# Patient Record
Sex: Male | Born: 1985 | Race: White | Hispanic: No | Marital: Single | Smoking: Never smoker
Health system: Southern US, Community
[De-identification: ages and names within clinical notes are randomized; demographics above are authoritative.]

## PROBLEM LIST (undated history)

## (undated) DIAGNOSIS — R1013 Epigastric pain: Secondary | ICD-10-CM

## (undated) HISTORY — DX: Epigastric pain: R10.13

## (undated) HISTORY — PX: WISDOM TOOTH EXTRACTION: SHX21

---
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
## 2008-01-29 ENCOUNTER — Ambulatory Visit: Payer: Self-pay | Admitting: Internal Medicine

## 2008-01-29 LAB — CONVERTED CEMR LAB
ALT: 19 units/L (ref 0–53)
AST: 22 units/L (ref 0–37)
Basophils Absolute: 0 10*3/uL (ref 0.0–0.1)
Basophils Relative: 0.2 % (ref 0.0–3.0)
Bilirubin, Direct: 0.2 mg/dL (ref 0.0–0.3)
CO2: 30 meq/L (ref 19–32)
Calcium: 9.6 mg/dL (ref 8.4–10.5)
Chloride: 109 meq/L (ref 96–112)
Creatinine, Ser: 1 mg/dL (ref 0.4–1.5)
Glucose, Bld: 89 mg/dL (ref 70–99)
Hemoglobin: 15.4 g/dL (ref 13.0–17.0)
LDL Cholesterol: 119 mg/dL — ABNORMAL HIGH (ref 0–99)
Leukocytes, UA: NEGATIVE
Lymphocytes Relative: 34.2 % (ref 12.0–46.0)
MCHC: 35.8 g/dL (ref 30.0–36.0)
Monocytes Relative: 7.6 % (ref 3.0–12.0)
Neutro Abs: 3.8 10*3/uL (ref 1.4–7.7)
Neutrophils Relative %: 52.2 % (ref 43.0–77.0)
Nitrite: NEGATIVE
RBC: 4.77 M/uL (ref 4.22–5.81)
Specific Gravity, Urine: 1.01 (ref 1.000–1.03)
Total Bilirubin: 1.2 mg/dL (ref 0.3–1.2)
Total CHOL/HDL Ratio: 5.9
Total Protein: 7.5 g/dL (ref 6.0–8.3)
Triglycerides: 161 mg/dL — ABNORMAL HIGH (ref 0–149)
Urobilinogen, UA: 0.2 (ref 0.0–1.0)
WBC: 7.3 10*3/uL (ref 4.5–10.5)
pH: 6 (ref 5.0–8.0)

## 2008-02-06 ENCOUNTER — Ambulatory Visit: Payer: Self-pay | Admitting: Internal Medicine

## 2008-02-06 DIAGNOSIS — J45909 Unspecified asthma, uncomplicated: Secondary | ICD-10-CM | POA: Insufficient documentation

## 2008-03-04 ENCOUNTER — Telehealth: Payer: Self-pay | Admitting: Internal Medicine

## 2009-08-05 ENCOUNTER — Telehealth: Payer: Self-pay | Admitting: Internal Medicine

## 2010-06-14 NOTE — Progress Notes (Signed)
  Phone Note Refill Request Message from:  Fax from Pharmacy on August 05, 2009 1:52 PM  Refills Requested: Medication #1:  VENTOLIN HFA 108 (90 BASE) MCG/ACT AERS 2 puffs qid as needed Initial call taken by: Ami Bullins CMA,  August 05, 2009 1:52 PM    Prescriptions: VENTOLIN HFA 108 (90 BASE) MCG/ACT AERS (ALBUTEROL SULFATE) 2 puffs qid as needed  #1 x 0   Entered by:   Ami Bullins CMA   Authorized by:   Jacques Navy MD   Signed by:   Bill Salinas CMA on 08/05/2009   Method used:   Electronically to        [facility]* (retail)       [address]       [place], [place]  [id number]       Ph: [phone]       Fax: [phone]   RxID:   [id number]

## 2011-01-03 ENCOUNTER — Telehealth: Payer: Self-pay | Admitting: *Deleted

## 2011-01-03 ENCOUNTER — Encounter: Payer: Self-pay | Admitting: Internal Medicine

## 2011-01-03 ENCOUNTER — Ambulatory Visit (INDEPENDENT_AMBULATORY_CARE_PROVIDER_SITE_OTHER): Payer: BC Managed Care – PPO | Admitting: Internal Medicine

## 2011-01-03 VITALS — BP 122/80 | HR 84 | Temp 97.9°F | Wt 195.0 lb

## 2011-01-03 DIAGNOSIS — R221 Localized swelling, mass and lump, neck: Secondary | ICD-10-CM

## 2011-01-03 DIAGNOSIS — R220 Localized swelling, mass and lump, head: Secondary | ICD-10-CM

## 2011-01-03 MED ORDER — IPRATROPIUM BROMIDE 0.03 % NA SOLN: NASAL | Status: DC

## 2011-01-03 NOTE — Telephone Encounter (Signed)
Pharm called - RX for atrovent nasal spray was sent in to pharm. They wanted to confirm if MD really wanted to prescribe nasal spray OR was it supposed to be the inhaler?

## 2011-01-03 NOTE — Telephone Encounter (Signed)
Called pharmacy. inhaler

## 2011-01-03 NOTE — Progress Notes (Signed)
  Subjective:    Patient ID: Dustin Roach, male    DOB: 1985/10/26, 25 y.o.   MRN: [id number]  HPI Dustin Roach presents for evaluation of a swelling in the throat described as a sense of swelling or mass effect. He says this is exacerbated by the rare, intermittent use of albuterol MDI. He does report that the sensation of swelling in his airway is improved WU:[id number] but the swelling sensation in the throat is worse. This feeling of mass or swelling in his throat has been present for 4-6 months. He does not smoke or drink excessively. He is otherwise healthy with no other c/o.  Past Medical History  Diagnosis Date  . Asthma   . Allergic rhinitis    No past surgical history on file. Family History  Problem Relation Age of Onset  . Diabetes Mother    History   Social History  . Marital Status: Single    Spouse Name: N/A    Number of Children: N/A  . Years of Education: N/A   Occupational History  . Not on file.   Social History Main Topics  . Smoking status: Not on file  . Smokeless tobacco: Not on file  . Alcohol Use: Not on file  . Drug Use: Not on file  . Sexually Active: Not on file   Other Topics Concern  . Not on file   Social History Narrative   HSG, [employer], applying for an MFAMarried '2006No childrenWork: pt account rep at [employer]       Review of Systems System review is negative for any constitutional, cardiac, pulmonary, GI or neuro symptoms or complaints     Objective:   Physical Exam Vitals noted - normal Gen'l - WNWD white male in no distress HEENT - normal exam with no visible swelling in the posterior pharynx, tonsils present but not enlarged, uvula was normal, A-P dimension appeared normal. Chest - CTAP w/o rales, or wheezes Cor - 2+ radial pulse, RRR       Assessment & Plan:  Throat swelling - this sounds like a problem that is independent of his use of bronchodilator, but made worse with use of albuterol. He has been using  albuterol without problems for years. Concern is for any mass or anatomic obstruction in the upper airway.  Plan - switch to atrovent MDI           Refer to ENT for evaluation for any upper airway mass or abnormality.

## 2011-01-10 ENCOUNTER — Telehealth: Payer: Self-pay | Admitting: Internal Medicine

## 2011-01-10 NOTE — Telephone Encounter (Signed)
Forwarded to Dr. Norins for Review. °

## 2012-04-24 ENCOUNTER — Telehealth: Payer: Self-pay | Admitting: Internal Medicine

## 2012-04-24 ENCOUNTER — Telehealth: Payer: Self-pay | Admitting: *Deleted

## 2012-04-24 NOTE — Telephone Encounter (Signed)
Pt called stating he needed to have some blood work done. Pt and his wife are seeing a fertility specialist at [facility]. They told him he needed certain blood work done. Advised pt that he needed to schedule an appt. Since he has not had a physical in some time, advised pt to schedule a physical exam. I forwarded the call to scheduling for pt.

## 2012-04-24 NOTE — Telephone Encounter (Signed)
Patient's wife is having a procedure in early January and he has to have STD testing done before this procedure takes place, has not been seen since August 2012 and you next physical is not open until after his deadline, is there anyway for him to have this done without being seen first or is there another way he needs to go about having this done?

## 2012-04-25 NOTE — Telephone Encounter (Signed)
Left message for patient to call back and schedule.

## 2012-04-25 NOTE — Telephone Encounter (Signed)
May add to schedule 12/12

## 2012-04-26 NOTE — Telephone Encounter (Signed)
Patient scheduled for the 30th for office visit.

## 2012-05-13 ENCOUNTER — Other Ambulatory Visit (INDEPENDENT_AMBULATORY_CARE_PROVIDER_SITE_OTHER): Payer: BC Managed Care – PPO

## 2012-05-13 ENCOUNTER — Encounter: Payer: Self-pay | Admitting: Internal Medicine

## 2012-05-13 ENCOUNTER — Ambulatory Visit (INDEPENDENT_AMBULATORY_CARE_PROVIDER_SITE_OTHER): Payer: BC Managed Care – PPO | Admitting: Internal Medicine

## 2012-05-13 VITALS — BP 122/80 | HR 79 | Temp 97.0°F | Resp 10 | Wt 204.0 lb

## 2012-05-13 DIAGNOSIS — Z202 Contact with and (suspected) exposure to infections with a predominantly sexual mode of transmission: Secondary | ICD-10-CM

## 2012-05-13 DIAGNOSIS — Z9189 Other specified personal risk factors, not elsewhere classified: Secondary | ICD-10-CM

## 2012-05-13 DIAGNOSIS — Z0000 Encounter for general adult medical examination without abnormal findings: Secondary | ICD-10-CM

## 2012-05-13 LAB — LIPID PANEL: Total CHOL/HDL Ratio: 6

## 2012-05-13 LAB — COMPREHENSIVE METABOLIC PANEL
ALT: 39 U/L (ref 0–53)
AST: 26 U/L (ref 0–37)
CO2: 30 mEq/L (ref 19–32)
Calcium: 9.4 mg/dL (ref 8.4–10.5)
Chloride: 101 mEq/L (ref 96–112)
GFR: 102.12 mL/min (ref >60.00)
Sodium: 141 mEq/L (ref 135–145)
Total Protein: 7.6 g/dL (ref 6.0–8.3)

## 2012-05-13 LAB — HEPATITIS C ANTIBODY: HCV Ab: NEGATIVE

## 2012-05-13 LAB — HEPATITIS B SURFACE ANTIGEN: Hepatitis B Surface Ag: NEGATIVE

## 2012-05-13 NOTE — Patient Instructions (Addendum)
Thanks for coming in. Labs are ordered included routine labs.,  Results will be mailed to sent to MyChart - see sign up sheet

## 2012-05-14 LAB — HEPATITIS B CORE ANTIBODY, TOTAL: Hep B Core Total Ab: NEGATIVE

## 2012-05-15 DIAGNOSIS — Z0000 Encounter for general adult medical examination without abnormal findings: Secondary | ICD-10-CM | POA: Insufficient documentation

## 2012-05-15 NOTE — Assessment & Plan Note (Signed)
Healthy white man with a normal exam. He is for STD screening and hepatitis screening - all negative.   Cholesterol panel reveals an LDL of 177 - he will need follow up: will recommend 6 months of life-style management with low fat diet with follow up lipid panel. If not below treatment threshold (LDL<160 per NCEP ATP III guidelines).

## 2012-05-15 NOTE — Progress Notes (Signed)
  Subjective:    Patient ID: Dustin Roach, male    DOB: [birth year], 27 y.o.   MRN: [id number]  HPI Dustin Roach presents for STD and hepatitis screening as part of infertility treatment for his wife. He reports that since his last ov he has been healthy.   Past Medical History  Diagnosis Date  . Asthma   . Allergic rhinitis    History reviewed. No pertinent past surgical history. Family History  Problem Relation Age of Onset  . Diabetes Mother   . Alcohol abuse Maternal Grandfather   . Alcohol abuse Paternal Grandfather   . Cancer Neg Hx   . Heart disease Neg Hx    History   Social History  . Marital Status: Single    Spouse Name: N/A    Number of Children: 0  . Years of Education: 16   Occupational History  . writer-films [employer]   Social History Main Topics  . Smoking status: Never Smoker   . Smokeless tobacco: Never Used  . Alcohol Use: 1.0 oz/week    2 drink(s) per week     Comment: beer  . Drug Use: No  . Sexually Active: Yes -- Male partner(s)   Other Topics Concern  . Not on file   Social History Narrative   HSG, [employer]. Married '2006. No children yet (Dec '13). 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     Current Outpatient Prescriptions on File Prior to Visit  Medication Sig Dispense Refill  . albuterol (VENTOLIN HFA) 108 (90 BASE) MCG/ACT inhaler Inhale 2 puffs into the lungs every 4 (four) hours as needed.        . cetirizine-pseudoephedrine (ZYRTEC-D) 5-120 MG per tablet Take 1 tablet by mouth daily.        . MULTIPLE VITAMIN PO Take by mouth.            Review of Systems System review is negative for any constitutional, cardiac, pulmonary, GI or neuro symptoms or complaints other than as described in the HPI.     Objective:   Physical Exam Filed Vitals:   05/13/12 1525  BP: 122/80  Pulse: 79  Temp: 97 F (36.1 C)  Resp: 10   Gen'l- WNWD white man in no  distress HEENT- C&S clear Cor - 2+ radial pulse, RRR Pulm - normal respirations Neuro - A&O x 3, normal exam.  Lab Results  Component Value Date   WBC 7.3 01/29/2008   HGB 15.4 01/29/2008   HCT 42.9 01/29/2008   PLT 217 01/29/2008   GLUCOSE 92 05/13/2012   CHOL 243* 05/13/2012   TRIG 207.0* 05/13/2012   HDL 41.40 05/13/2012   LDLDIRECT 177.6 05/13/2012   LDLCALC 119* 01/29/2008   ALT 39 05/13/2012   AST 26 05/13/2012   NA 141 05/13/2012   K 4.1 05/13/2012   CL 101 05/13/2012   CREATININE 0.9 05/13/2012   BUN 11 05/13/2012   CO2 30 05/13/2012   TSH 1.71 01/29/2008   Heb B negative, Hep c negative, HIV negative, RPR - nonreactive     Assessment & Plan:

## 2012-05-16 ENCOUNTER — Encounter: Payer: Self-pay | Admitting: Internal Medicine

## 2012-05-22 ENCOUNTER — Telehealth: Payer: Self-pay | Admitting: Internal Medicine

## 2012-05-22 NOTE — Telephone Encounter (Signed)
Copy of pt's labs faxed to Dr Elesa Hacker at [facility]. Fax number [phone].

## 2012-05-22 NOTE — Telephone Encounter (Signed)
Patient is requesting that his most recent labs be sent to Dr. Elesa Hacker @ [facility], the fax is [phone]

## 2012-06-29 ENCOUNTER — Other Ambulatory Visit: Payer: Self-pay

## 2013-03-20 ENCOUNTER — Other Ambulatory Visit: Payer: Self-pay

## 2014-09-21 ENCOUNTER — Encounter: Payer: Self-pay | Admitting: Internal Medicine

## 2014-09-21 ENCOUNTER — Ambulatory Visit (INDEPENDENT_AMBULATORY_CARE_PROVIDER_SITE_OTHER): Payer: BLUE CROSS/BLUE SHIELD | Admitting: Internal Medicine

## 2014-09-21 VITALS — BP 126/84 | HR 82 | Temp 98.4°F | Ht 70.5 in | Wt 213.0 lb

## 2014-09-21 DIAGNOSIS — J069 Acute upper respiratory infection, unspecified: Secondary | ICD-10-CM | POA: Diagnosis not present

## 2014-09-21 MED ORDER — AMOXICILLIN 500 MG PO CAPS: 500.0000 mg | ORAL_CAPSULE | Freq: Three times a day (TID) | ORAL | Status: DC

## 2014-09-21 NOTE — Patient Instructions (Signed)
Plain Mucinex (NOT D) for thick secretions ;force NON dairy fluids .   Nasal cleansing in the shower as discussed with lather of mild shampoo.After 10 seconds wash off lather while  exhaling through nostrils. Make sure that all residual soap is removed to prevent irritation.  Flonase OR Nasacort AQ 1 spray in each nostril twice a day as needed. Use the "crossover" technique into opposite nostril spraying toward opposite ear @ 45 degree angle, not straight up into nostril.  Plain Allegra (NOT D )  160 daily , Loratidine 10 mg , OR Zyrtec 10 mg @ bedtime  as needed for itchy eyes & sneezing.  Zicam Melts or Zinc lozenges as per package label for sore throat .   

## 2014-09-21 NOTE — Progress Notes (Signed)
Pre visit review using our clinic review tool, if applicable. No additional management support is needed unless otherwise documented below in the visit note. 

## 2014-09-21 NOTE — Progress Notes (Signed)
   Subjective:    Patient ID: Dustin Roach, male    DOB: [birth year], 29 y.o.   MRN: [id number]  HPI His symptoms originally began 09/09/14 as head congestion with some clear-slightly yellow nasal secretions. This was associated with postnasal drainage as well as some chest congestion. The symptoms actually improved only to exacerbate later.  As of 5/5 he developed sore throat, fever up to 100, chills, and sweats.  The discolored nasal secretions persist as well as some ear pressure. He also has some itchy, watery eyes.  His daughter 4219 months old has been ill with respiratory tract infection as has some of her  friends and the parents of those friends.  He's been using Mucinex D and Advil with some partial response.  He has an occasional cigar. His childhood asthma.  He has no other upper respiratory tract  infection symptoms   Review of Systems Frontal headache, facial pain ,  dental pain, sore throat ,  or otic discharge denied. There is no significant sputum production, wheezing,or  paroxysmal nocturnal dyspnea.      Objective:   Physical Exam  General appearance:Adequately nourished; no acute distress or increased work of breathing is present.   Pattern alopecia; mustache and beard present.  Lymphatic: No  lymphadenopathy about the head, neck, or axilla .  Eyes: No conjunctival inflammation or lid edema is present. There is slight scleritis. Extraocular motion and vision are intact  Ears:  External ear exam shows no significant lesions or deformities.  Bilateral ear posts.Otoscopic examination reveals clear canals, tympanic membranes are intact bilaterally without bulging, retraction, or discharge. Slight erythema right tympanic membranes  Nose:  External nasal examination shows no deformity or inflammation. Nasal mucosa are significantly erythematous without lesions or exudates No septal dislocation or deviation.No obstruction to airflow.   Oral exam: Dental hygiene  is good; lips and gums are healthy appearing.There is punctate exudate of each tonsil.   Neck:  No deformities, thyromegaly, masses, or tenderness noted.   Supple with full range of motion without pain.   Heart:  Normal rate and regular rhythm. S1 and S2 normal without gallop, murmur, click, rub or other extra sounds.   Lungs:Chest clear to auscultation; no wheezes, rhonchi,rales ,or rubs present.  Extremities:  No cyanosis, edema, or clubbing  noted    Skin: Warm & dry w/o tenting or jaundice. No significant lesions or rash.       Assessment & Plan:  #1 upper respiratory tract infection with suggestion of tonsillitis and possible rhinosinusitis  Plan: See orders/ recommendations

## 2015-02-08 ENCOUNTER — Telehealth: Payer: Self-pay | Admitting: *Deleted

## 2015-02-08 MED ORDER — ALBUTEROL SULFATE HFA 108 (90 BASE) MCG/ACT IN AERS: 2.0000 | INHALATION_SPRAY | RESPIRATORY_TRACT | Status: DC | PRN

## 2015-02-08 NOTE — Telephone Encounter (Signed)
Pt is requesting refill on his inhaler. Inform pt that he need to set-up appt with new provider to continuity of care...Raechel Chute

## 2017-07-06 ENCOUNTER — Telehealth: Payer: BLUE CROSS/BLUE SHIELD | Admitting: Family

## 2017-07-06 DIAGNOSIS — N50819 Testicular pain, unspecified: Secondary | ICD-10-CM | POA: Diagnosis not present

## 2017-07-06 DIAGNOSIS — Z719 Counseling, unspecified: Secondary | ICD-10-CM

## 2017-07-06 NOTE — Progress Notes (Signed)
Thank you for the details you included in the comment boxes. Those details are very helpful in determining the best course of treatment for you and help us to provide the best care. Please go to a physical location immediately as there are several things this can be which are very serious.  Based on what you shared with me it looks like you have a serious condition that should be evaluated in a face to face office visit.  NOTE: If you entered your credit card information for this eVisit, you will not be charged. You may see a "hold" on your card for the $30 but that hold will drop off and you will not have a charge processed.  If you are having a true medical emergency please call 911.  If you need an urgent face to face visit, [facility] has four urgent care centers for your convenience.  If you need care fast and have a high deductible or no insurance consider:   [email or web address] to reserve your spot online an avoid wait times  [facility] [address], [address] [place], [place] [address] 8 am to 8 pm Monday-Friday 10 am to 4 pm Saturday-Sunday *Across the street from [employer]  [facility]  [address] [place], [address] 8 am to 5 pm Monday-Friday * In the [facility] on the [facility]   The following sites will take your  insurance:  . [facility]  [phone] Get Driving Directions Find a Provider at this Location  [address] [place], [place] [address] . 10 am to 8 pm Monday-Friday . 12 pm to 8 pm Saturday-Sunday   . [facility] at [facility]  [phone] Get Driving Directions Find a Provider at this Location  [address] [place], [place] [address] . 8 am to 8 pm Monday-Friday . 9 am to 6 pm Saturday . 11 am to 6 pm Sunday   . [facility] at [facility]  [phone] Get Driving Directions  [address] [place], [place] [address] . 8 am to 8 pm Monday-Friday . 8 am to 4 pm Saturday-Sunday   Your e-visit answers were reviewed by a board certified advanced clinical practitioner to complete your personal care plan.  Thank you for using e-Visits.

## 2017-08-15 ENCOUNTER — Encounter: Payer: Self-pay | Admitting: Family

## 2017-08-15 ENCOUNTER — Ambulatory Visit (INDEPENDENT_AMBULATORY_CARE_PROVIDER_SITE_OTHER): Payer: BLUE CROSS/BLUE SHIELD | Admitting: Family

## 2017-08-15 ENCOUNTER — Other Ambulatory Visit (INDEPENDENT_AMBULATORY_CARE_PROVIDER_SITE_OTHER): Payer: BLUE CROSS/BLUE SHIELD

## 2017-08-15 VITALS — BP 108/78 | HR 62 | Temp 98.7°F | Ht 70.5 in | Wt 222.0 lb

## 2017-08-15 DIAGNOSIS — J309 Allergic rhinitis, unspecified: Secondary | ICD-10-CM

## 2017-08-15 DIAGNOSIS — R1084 Generalized abdominal pain: Secondary | ICD-10-CM

## 2017-08-15 DIAGNOSIS — R062 Wheezing: Secondary | ICD-10-CM

## 2017-08-15 LAB — CBC WITH DIFFERENTIAL/PLATELET
Basophils Absolute: 0.1 10*3/uL (ref 0.0–0.1)
Basophils Relative: 0.8 % (ref 0.0–3.0)
EOS PCT: 1.2 % (ref 0.0–5.0)
Eosinophils Absolute: 0.1 10*3/uL (ref 0.0–0.7)
HEMATOCRIT: 42.3 % (ref 39.0–52.0)
HEMOGLOBIN: 14.8 g/dL (ref 13.0–17.0)
LYMPHS ABS: 1.9 10*3/uL (ref 0.7–4.0)
Lymphocytes Relative: 25.5 % (ref 12.0–46.0)
MCHC: 35.1 g/dL (ref 30.0–36.0)
MCV: 88.5 fl (ref 78.0–100.0)
Monocytes Absolute: 0.5 10*3/uL (ref 0.1–1.0)
Monocytes Relative: 6.3 % (ref 3.0–12.0)
Neutro Abs: 4.8 10*3/uL (ref 1.4–7.7)
Neutrophils Relative %: 66.2 % (ref 43.0–77.0)
Platelets: 252 10*3/uL (ref 150.0–400.0)
RBC: 4.78 Mil/uL (ref 4.22–5.81)
RDW: 12.6 % (ref 11.5–15.5)
WBC: 7.3 10*3/uL (ref 4.0–10.5)

## 2017-08-15 LAB — COMPREHENSIVE METABOLIC PANEL
ALBUMIN: 4.4 g/dL (ref 3.5–5.2)
ALT: 14 U/L (ref 0–53)
AST: 14 U/L (ref 0–37)
Alkaline Phosphatase: 54 U/L (ref 39–117)
BUN: 14 mg/dL (ref 6–23)
CO2: 27 meq/L (ref 19–32)
Calcium: 9.7 mg/dL (ref 8.4–10.5)
Chloride: 102 mEq/L (ref 96–112)
Creatinine, Ser: 0.98 mg/dL (ref 0.40–1.50)
GFR: 92.93 mL/min (ref >60.00)
Glucose, Bld: 91 mg/dL (ref 70–99)
Potassium: 4 mEq/L (ref 3.5–5.1)
Sodium: 138 mEq/L (ref 135–145)
Total Bilirubin: 0.6 mg/dL (ref 0.2–1.2)
Total Protein: 7.4 g/dL (ref 6.0–8.3)

## 2017-08-15 MED ORDER — ALBUTEROL SULFATE (2.5 MG/3ML) 0.083% IN NEBU
2.5000 mg | INHALATION_SOLUTION | Freq: Once | RESPIRATORY_TRACT | Status: AC
Start: 2017-08-15 — End: 2017-08-15
  Administered 2017-08-15: 2.5 mg via RESPIRATORY_TRACT

## 2017-08-15 MED ORDER — ALBUTEROL SULFATE HFA 108 (90 BASE) MCG/ACT IN AERS: 2.0000 | INHALATION_SPRAY | RESPIRATORY_TRACT | 0 refills | Status: AC | PRN

## 2017-08-15 NOTE — Progress Notes (Signed)
Dustin Roach is a 32 y.o. male with the following history as recorded in EpicCare:  Patient Active Problem List   Diagnosis Date Noted  . Routine health maintenance 05/15/2012  . Allergic asthma 02/06/2008    Current Outpatient Medications  Medication Sig Dispense Refill  . albuterol (VENTOLIN HFA) 108 (90 Base) MCG/ACT inhaler Inhale 2 puffs into the lungs every 4 (four) hours as needed. 18 g 0  . MULTIPLE VITAMIN PO Take by mouth.      Marland Kitchen omeprazole (PRILOSEC) 20 MG capsule Take 20 mg by mouth daily.     No current facility-administered medications for this visit.     Allergies: Patient has no known allergies.  Past Medical History:  Diagnosis Date  . Allergic rhinitis   . Asthma     History reviewed. No pertinent surgical history.  Family History  Problem Relation Age of Onset  . Diabetes Mother   . Alcohol abuse Maternal Grandfather   . Alcohol abuse Paternal Grandfather   . Cancer Neg Hx   . Heart disease Neg Hx     Social History   Tobacco Use  . Smoking status: Never Smoker  . Smokeless tobacco: Never Used  Substance Use Topics  . Alcohol use: Yes    Alcohol/week: 1.0 oz    Types: 2 drink(s) per week    Comment: beer    Subjective:  Patient presents with concerns for increased abdominal pain/ bloating x 2-3 months; feels like increased gas pain; tried OTC probiotics with no relief; talked to provider through [facility] service and thought may have gastritis/ ulcer; has been prescribed Prilosec 20 mg; started this medication in the past few days and can already see some benefit; admits that stress level has been high stress- 32 yo and 70 month old; not sleeping well; under increased work stress; has recently started working with a Social worker to help with anxiety issues; denies any changes in bowel habits- known history of hemorrhoids but denies any blood; no vomiting or coffee grounds emesis;   Objective:  Vitals:   08/15/17 0846  BP: 108/78  Pulse: 62  Temp:  98.7 F (37.1 C)  TempSrc: Oral  SpO2: 97%  Weight: 222 lb 0.6 oz (100.7 kg)  Height: 5' 10.5" (1.791 m)    General: Well developed, well nourished, in no acute distress  Skin : Warm and dry.  Head: Normocephalic and atraumatic  Lungs: Respirations unlabored; clear to auscultation bilaterally without wheeze, rales, rhonchi  CVS exam: normal rate and regular rhythm.  Abdomen: Soft; nontender; nondistended; normoactive bowel sounds; no masses or hepatosplenomegaly  Neurologic: Alert and oriented; speech intact; face symmetrical; moves all extremities well; CNII-XII intact without focal deficit  Assessment:  1. Generalized abdominal pain   2. Allergic rhinitis, unspecified seasonality, unspecified trigger     Plan:  Suspect gastritis- due to increased stress; patient already seeing benefit with OTC Prilosec; he will increase to bid x 7 days and then decrease to qd; work on stress reduction/ healthy eating; check CBC, CMP today;  recommended abdominal ultrasound- he prefers to wait at this time; he will call back after completing the 2 weeks of Prilosec to let us know he has responded; follow-up to be determined at that time- may eventually need GI evaluation  Refill given on his albuterol inhaler.     No follow-ups on file.  Orders Placed This Encounter  Procedures  . US Abdomen Complete    Standing Status:   Future    Standing  Expiration Date:   10/16/2018    Order Specific Question:   Reason for Exam (SYMPTOM  OR DIAGNOSIS REQUIRED)    Answer:   abdominal pain    Order Specific Question:   Preferred imaging location?    Answer:   [facility]  . CBC w/Diff    Standing Status:   Future    Number of Occurrences:   1    Standing Expiration Date:   08/15/2018  . Comp Met (CMET)    Standing Status:   Future    Number of Occurrences:   1    Standing Expiration Date:   08/15/2018    Requested Prescriptions   Signed Prescriptions Disp Refills  . albuterol (VENTOLIN HFA) 108  (90 Base) MCG/ACT inhaler 18 g 0    Sig: Inhale 2 puffs into the lungs every 4 (four) hours as needed.

## 2017-08-15 NOTE — Addendum Note (Signed)
Addended by: Karma GanjaSMITH, Lilibeth Opie J on: 08/15/2017 12:02 PM   Modules accepted: Orders

## 2017-08-15 NOTE — Patient Instructions (Signed)
Gastritis, Adult Gastritis is inflammation of the stomach. There are two kinds of gastritis:  Acute gastritis. This kind develops suddenly.  Chronic gastritis. This kind lasts for a long time.  Gastritis happens when the lining of the stomach becomes weak or gets damaged. Without treatment, gastritis can lead to stomach bleeding and ulcers. What are the causes? This condition may be caused by:  An infection.  Drinking too much alcohol.  Certain medicines.  Having too much acid in the stomach.  A disease of the intestines or stomach.  Stress.  What are the signs or symptoms? Symptoms of this condition include:  Pain or a burning in the upper abdomen.  Nausea.  Vomiting.  An uncomfortable feeling of fullness after eating.  In some cases, there are no symptoms. How is this diagnosed? This condition may be diagnosed with:  A description of your symptoms.  A physical exam.  Tests. These can include: ? Blood tests. ? Stool tests. ? A test in which a thin, flexible instrument with a light and camera on the end is passed down the esophagus and into the stomach (upper endoscopy). ? A test in which a sample of tissue is taken for testing (biopsy).  How is this treated? This condition may be treated with medicines. If the condition is caused by a bacterial infection, you may be given antibiotic medicines. If it is caused by too much acid in the stomach, you may get medicines called H2 blockers, proton pump inhibitors, or antacids. Treatment may also involve stopping the use of certain medicines, such as aspirin, ibuprofen, or other nonsteroidal anti-inflammatory drugs (NSAIDs). Follow these instructions at home:  Take over-the-counter and prescription medicines only as told by your health care provider.  If you were prescribed an antibiotic, take it as told by your health care provider. Do not stop taking the antibiotic even if you start to feel better.  Drink enough  fluid to keep your urine clear or pale yellow.  Eat small, frequent meals instead of large meals. Contact a health care provider if:  Your symptoms get worse.  Your symptoms return after treatment. Get help right away if:  You vomit blood or material that looks like coffee grounds.  You have black or dark red stools.  You are unable to keep fluids down.  Your abdominal pain gets worse.  You have a fever.  You do not feel better after 1 week. This information is not intended to replace advice given to you by your health care provider. Make sure you discuss any questions you have with your health care provider. Document Released: 04/25/2001 Document Revised: 12/29/2015 Document Reviewed: 01/23/2015 Elsevier Interactive Patient Education  2018 Elsevier Inc.  

## 2017-08-16 NOTE — Progress Notes (Signed)
Spoke with patient and info given. He will call back if he is not improved. In the meantime he did have questions regarding if it could possibly be bacteria related? He said he didn't mention it to you but had thought about it. Also if it was bacterial he wants to know what type of test would be ran to check for bacteria and if you thought this would be reasonable? Also he knows to call us back in 2 weeks to let us know how he is doing. He said if he didn't see improvement he would be ready to proceed with ultrasound.

## 2017-08-20 ENCOUNTER — Encounter: Payer: Self-pay | Admitting: Family

## 2017-08-20 NOTE — Progress Notes (Signed)
Letter sent as requested.

## 2017-08-27 ENCOUNTER — Telehealth: Payer: Self-pay | Admitting: Family

## 2017-08-27 DIAGNOSIS — K219 Gastro-esophageal reflux disease without esophagitis: Secondary | ICD-10-CM

## 2017-08-27 NOTE — Telephone Encounter (Signed)
Copied from CRM [phone]. Topic: Quick Communication - See Telephone Encounter >> Aug 27, 2017 11:28 AM Eston Mouldavis, Kiyara Bouffard B wrote: CRM for notification. See Telephone encounter for: 08/27/17. PT called to let Ria ClockLaura Murray  know that he is a little  better but is experiencing a new symptom-  tingling all over after eating,  he wants to know if she recommends he see a GI specialist PT also says he wants to go the most cost effective route   his contact number is [phone]

## 2017-08-27 NOTE — Telephone Encounter (Signed)
Please advise. Thanks.  

## 2017-08-27 NOTE — Telephone Encounter (Signed)
Patient has apt to see Dr. Stan Headarl Gessner on 08/29/2017 at 9:00 AM.

## 2017-08-27 NOTE — Telephone Encounter (Signed)
Yes, let's go ahead with GI evaluation; will put in referral for him. I can't speak to costs associated with seeing another provider; let's have him do the appointment and see what they recommend.

## 2017-08-29 ENCOUNTER — Ambulatory Visit (INDEPENDENT_AMBULATORY_CARE_PROVIDER_SITE_OTHER): Payer: BLUE CROSS/BLUE SHIELD | Admitting: Internal Medicine

## 2017-08-29 ENCOUNTER — Encounter: Payer: Self-pay | Admitting: Internal Medicine

## 2017-08-29 VITALS — BP 110/78 | HR 83 | Ht 70.5 in | Wt 215.0 lb

## 2017-08-29 DIAGNOSIS — R1013 Epigastric pain: Secondary | ICD-10-CM

## 2017-08-29 DIAGNOSIS — F439 Reaction to severe stress, unspecified: Secondary | ICD-10-CM

## 2017-08-29 DIAGNOSIS — K588 Other irritable bowel syndrome: Secondary | ICD-10-CM | POA: Diagnosis not present

## 2017-08-29 NOTE — Progress Notes (Signed)
Dustin Roach 32 y.o. 1985/07/26 [id number] Referred by: Olive Bass,*  Assessment & Plan:   Encounter Diagnoses  Name Primary?  . Dyspepsia Yes  . Other irritable bowel syndrome   . 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  H. pylori infection is a possibility.  Situational stress certainly may be playing a role.  No worrisome features here.  I explained warning signs of dysphagia bleeding/anemia and unintentional weight loss and contact me if that occurs.  Will try therapy with as needed IB Gard. Continue Prilosec 20 mg daily 1 month total Low gas flatulence diet is recommended and information provided counseling is wise and he will continue  He is to contact me in about a month with an update.  If he continues to have problems would wait 2 weeks off PPI and do an H. pylori stool antigen.  At this point though it was not unreasonable to order an ultrasound of the abdomen  given abdominal pain we will hold off on that  I appreciate the opportunity to care for this patient.   I will send a copy to Ria Clock FNP Subjective:   Chief Complaint: Reflux gas hemorrhoids  HPI The patient is a 32 year old married white man with a 2-54-month history of increased gas, perhaps really began in late 2018, and a left upper quadrant bubbling and discomfort.  Antacids as needed help a little bit.  He tried a probiotic but that intensified things.  He was seen by primary care and started on Prilosec twice daily and now on daily at 20 mg dose and says he feels about 40% better.  He does also relate sometimes a postprandial tingling in the lower legs as well and wonders why.  There is an occasional sharp pain in the left upper quadrant.  He has belching and flatulence.  He believes he has hemorrhoids, he can feel pressure, and he has variable size in stools and occasional itching.  He can feel swollen  hemorrhoids protruding at times also he says.  He has eliminated caffeine citrus and other reflux provoking foods.  He does admit to be under family and work stress.  He has started seeing a Veterinary surgeon .  He has a toddler and infant home with wife.  GI review of systems is otherwise negative.  He has been wondering about the possibility of H. pylori infection.  Normal CBC and CMET 08/15/2017  An abdominal ultrasound was ordered but has not done No Known Allergies Prilosec over-the-counter 20 mg daily Past Medical History:  Diagnosis Date  . Allergic rhinitis   . Asthma    Past Surgical History:  Procedure Laterality Date  . WISDOM TOOTH EXTRACTION     Social History   Social History Narrative   HS[employer], [employer]. Married '2006.    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    Some alcohol, no tobacco no drug use   08/31/2017   family history includes Alcohol abuse in his maternal grandfather and paternal grandfather; Diabetes in his mother.   Review of Systems As per HPI all other review of systems appear negative.  Objective:   Physical Exam @BP  110/78   Pulse 83   Ht 5' 10.5" (1.791 m)   Wt 215 lb (97.5 kg)   BMI 30.41 kg/m @  General:  Well-developed, well-nourished and in no acute distress Eyes:  anicteric. ENT:   Mouth and posterior pharynx free of lesions.  Neck:   supple w/o thyromegaly or mass.  Lungs: Clear to auscultation bilaterally. Heart:  S1S2, no rubs, murmurs, gallops. Abdomen:  soft, non-tender, no hepatosplenomegaly, hernia, or mass and BS+.  Rectal: Nontender no mass brown stool Lymph:  no cervical or supraclavicular adenopathy. Extremities:   no edema, cyanosis or clubbing Skin   no rash. Neuro:  A&O x 3.  Psych:  appropriate mood and  Affect.   Data Reviewed:  Primary care notes labs in the computer

## 2017-08-29 NOTE — Patient Instructions (Addendum)
Normal BMI (Body Mass Index- based on height and weight) is between 19 and 25. Your BMI today is Body mass index is 30.41 kg/m. Marland Kitchen. Please consider follow up  regarding your BMI with your Primary Care Provider.   Today we are giving you IBgard samples to try and also a coupon.   Please read the gas and flatulence diet handout we are providing you with.   Take your Prilosec for a month and then message us as to how your doing.  At that point Dr Leone PayorGessner will decide if you need more testing.   Follow up with us 10/18/17 at 9:00AM.    I appreciate the opportunity to care for you. Stan Headarl Gessner, MD, [facility]

## 2017-08-31 ENCOUNTER — Encounter: Payer: Self-pay | Admitting: Internal Medicine

## 2017-09-05 ENCOUNTER — Ambulatory Visit: Payer: BLUE CROSS/BLUE SHIELD | Admitting: Gastroenterology

## 2017-10-09 ENCOUNTER — Encounter: Payer: Self-pay | Admitting: Internal Medicine

## 2017-10-15 ENCOUNTER — Ambulatory Visit: Payer: BLUE CROSS/BLUE SHIELD | Admitting: Nurse Practitioner

## 2017-10-18 ENCOUNTER — Ambulatory Visit: Payer: BLUE CROSS/BLUE SHIELD | Admitting: Internal Medicine

## 2017-12-11 DIAGNOSIS — Z8249 Family history of ischemic heart disease and other diseases of the circulatory system: Secondary | ICD-10-CM | POA: Diagnosis not present

## 2017-12-11 DIAGNOSIS — R0789 Other chest pain: Secondary | ICD-10-CM | POA: Diagnosis not present

## 2017-12-11 DIAGNOSIS — K30 Functional dyspepsia: Secondary | ICD-10-CM | POA: Diagnosis not present

## 2018-01-17 DIAGNOSIS — R109 Unspecified abdominal pain: Secondary | ICD-10-CM | POA: Diagnosis not present

## 2018-01-17 DIAGNOSIS — Z13220 Encounter for screening for lipoid disorders: Secondary | ICD-10-CM | POA: Diagnosis not present

## 2018-01-17 DIAGNOSIS — Z0000 Encounter for general adult medical examination without abnormal findings: Secondary | ICD-10-CM | POA: Diagnosis not present

## 2018-02-15 DIAGNOSIS — K30 Functional dyspepsia: Secondary | ICD-10-CM | POA: Diagnosis not present

## 2018-02-15 DIAGNOSIS — R109 Unspecified abdominal pain: Secondary | ICD-10-CM | POA: Diagnosis not present

## 2018-05-10 ENCOUNTER — Other Ambulatory Visit: Payer: Self-pay | Admitting: Gastroenterology

## 2018-05-10 DIAGNOSIS — R1013 Epigastric pain: Secondary | ICD-10-CM | POA: Diagnosis not present

## 2018-05-10 DIAGNOSIS — R1012 Left upper quadrant pain: Secondary | ICD-10-CM | POA: Diagnosis not present

## 2018-05-16 ENCOUNTER — Ambulatory Visit
Admission: RE | Admit: 2018-05-16 | Discharge: 2018-05-16 | Disposition: A | Discharge status: Home / Self Care | Payer: BLUE CROSS/BLUE SHIELD | Source: Ambulatory Visit | Attending: Gastroenterology | Admitting: Gastroenterology

## 2018-05-16 DIAGNOSIS — R1013 Epigastric pain: Secondary | ICD-10-CM | POA: Diagnosis not present

## 2018-05-27 DIAGNOSIS — K2930 Chronic superficial gastritis without bleeding: Secondary | ICD-10-CM | POA: Diagnosis not present

## 2018-05-27 DIAGNOSIS — R1013 Epigastric pain: Secondary | ICD-10-CM | POA: Diagnosis not present

## 2018-05-30 DIAGNOSIS — K2930 Chronic superficial gastritis without bleeding: Secondary | ICD-10-CM | POA: Diagnosis not present

## 2018-09-23 DIAGNOSIS — R300 Dysuria: Secondary | ICD-10-CM | POA: Diagnosis not present

## 2018-11-15 ENCOUNTER — Other Ambulatory Visit: Payer: Self-pay

## 2018-11-15 ENCOUNTER — Encounter (HOSPITAL_COMMUNITY): Payer: Self-pay | Admitting: Emergency Medicine

## 2018-11-15 ENCOUNTER — Emergency Department (HOSPITAL_COMMUNITY)
Admission: EM | Admit: 2018-11-15 | Discharge: 2018-11-15 | Disposition: A | Discharge status: Home / Self Care | Payer: BLUE CROSS/BLUE SHIELD | Attending: Emergency Medicine | Admitting: Emergency Medicine

## 2018-11-15 DIAGNOSIS — I499 Cardiac arrhythmia, unspecified: Secondary | ICD-10-CM | POA: Diagnosis not present

## 2018-11-15 DIAGNOSIS — I959 Hypotension, unspecified: Secondary | ICD-10-CM | POA: Diagnosis not present

## 2018-11-15 DIAGNOSIS — F1290 Cannabis use, unspecified, uncomplicated: Secondary | ICD-10-CM | POA: Diagnosis not present

## 2018-11-15 DIAGNOSIS — Z8709 Personal history of other diseases of the respiratory system: Secondary | ICD-10-CM | POA: Insufficient documentation

## 2018-11-15 DIAGNOSIS — R55 Syncope and collapse: Secondary | ICD-10-CM | POA: Diagnosis not present

## 2018-11-15 DIAGNOSIS — R0602 Shortness of breath: Secondary | ICD-10-CM | POA: Diagnosis not present

## 2018-11-15 DIAGNOSIS — R42 Dizziness and giddiness: Secondary | ICD-10-CM | POA: Diagnosis not present

## 2018-11-15 DIAGNOSIS — F10129 Alcohol abuse with intoxication, unspecified: Secondary | ICD-10-CM | POA: Diagnosis not present

## 2018-11-15 LAB — CBC WITH DIFFERENTIAL/PLATELET
Abs Immature Granulocytes: 0.01 10*3/uL (ref 0.00–0.07)
Basophils Absolute: 0 10*3/uL (ref 0.0–0.1)
Basophils Relative: 0 %
Eosinophils Absolute: 0.1 10*3/uL (ref 0.0–0.5)
Eosinophils Relative: 2 %
HCT: 41.6 % (ref 39.0–52.0)
Hemoglobin: 14.5 g/dL (ref 13.0–17.0)
Immature Granulocytes: 0 %
Lymphocytes Relative: 46 %
Lymphs Abs: 3.7 10*3/uL (ref 0.7–4.0)
MCH: 31 pg (ref 26.0–34.0)
MCHC: 34.9 g/dL (ref 30.0–36.0)
MCV: 88.9 fL (ref 80.0–100.0)
Monocytes Absolute: 0.6 10*3/uL (ref 0.1–1.0)
Monocytes Relative: 7 %
Neutro Abs: 3.7 10*3/uL (ref 1.7–7.7)
Neutrophils Relative %: 45 %
Platelets: 238 10*3/uL (ref 150–400)
RBC: 4.68 MIL/uL (ref 4.22–5.81)
RDW: 12.3 % (ref 11.5–15.5)
WBC: 8.2 10*3/uL (ref 4.0–10.5)
nRBC: 0 % (ref 0.0–0.2)

## 2018-11-15 LAB — BASIC METABOLIC PANEL
Anion gap: 11 (ref 5–15)
BUN: 11 mg/dL (ref 6–20)
CO2: 23 mmol/L (ref 22–32)
Calcium: 9.3 mg/dL (ref 8.9–10.3)
Chloride: 105 mmol/L (ref 98–111)
Creatinine, Ser: 1.09 mg/dL (ref 0.61–1.24)
GFR calc Af Amer: >60 mL/min (ref >60)
GFR calc non Af Amer: >60 mL/min (ref >60)
Glucose, Bld: 107 mg/dL — ABNORMAL HIGH (ref 70–99)
Potassium: 3.5 mmol/L (ref 3.5–5.1)
Sodium: 139 mmol/L (ref 135–145)

## 2018-11-15 LAB — ETHANOL: Alcohol, Ethyl (B): <10 mg/dL (ref <10)

## 2018-11-15 MED ORDER — SODIUM CHLORIDE 0.9 % IV BOLUS (SEPSIS)
1000.0000 mL | Freq: Once | INTRAVENOUS | Status: AC
  Administered 2018-11-15: 1000 mL via INTRAVENOUS

## 2018-11-15 NOTE — Discharge Instructions (Addendum)
Please avoid alcohol this weekend.  Be sure to drink plenty of fluids. If you have any chest pain or pass out, you need to come to the ER immediately. If you have any episodes of nearly passing out, please follow with your primary doctor

## 2018-11-15 NOTE — ED Notes (Signed)
Pt reports getting up to use the bathroom and getting dizzy. Pt reports laying himself on the ground before passing out however he never did. Pt does endorse a THC edible and ETOH prior to bed.

## 2018-11-15 NOTE — ED Triage Notes (Signed)
Pt BIB [facility], reports near syncopal episode after getting up to use the bathroom. Pt c/o feeling dizzy. Denies LOC, nausea/vomiting/diaphoresis. Reports drinking ETOH tonight and have a "THC rice krispy". On EMS arrival, HR 40, SBP 80s.

## 2018-11-15 NOTE — ED Notes (Signed)
Patient verbalizes understanding of discharge instructions. Opportunity for questioning and answers were provided. Armband removed by staff, pt discharged from ED home via POV.  

## 2018-11-15 NOTE — ED Provider Notes (Signed)
[facility] EMERGENCY DEPARTMENT Provider Note   CSN: [id number] Arrival date & time: 11/15/18  0417     History   Chief Complaint Chief Complaint  Patient presents with  . Near Syncope    HPI Dustin Roach is a 33 y.o. male.     The history is provided by the patient.  Near Syncope This is a new problem. The current episode started less than 1 hour ago. The problem occurs constantly. The problem has been gradually improving. Pertinent negatives include no chest pain and no shortness of breath. Exacerbated by: standing. The symptoms are relieved by rest.  Patient with history of asthma presents with near syncopal episode Patient reports he was sleeping, and felt thirsty.  He stood up to go to the bathroom and he felt lightheaded.  He did not lose full consciousness.  No seizures.  No tongue lacerations or incontinence.  No acute head injuries.  No traumatic injury.  No chest pain. He is improved but still not back to baseline. No previous history of syncope or cardiac issues Patient admits to drinking alcohol tonight and using a marijuana edible Past Medical History:  Diagnosis Date  . Allergic rhinitis   . Asthma   . Dyspepsia     Patient Active Problem List   Diagnosis Date Noted  . Routine health maintenance 05/15/2012  . Allergic asthma 02/06/2008    Past Surgical History:  Procedure Laterality Date  . WISDOM TOOTH EXTRACTION          Home Medications    Prior to Admission medications   Medication Sig Start Date End Date Taking? Authorizing Provider  albuterol (VENTOLIN HFA) 108 (90 Base) MCG/ACT inhaler Inhale 2 puffs into the lungs every 4 (four) hours as needed. 08/15/17   Marrian Salvage, FNP  MULTIPLE VITAMIN PO Take by mouth.      [provider]  omeprazole (PRILOSEC) 20 MG capsule Take 20 mg by mouth daily.    [provider]  OVER THE COUNTER MEDICATION IBgard Take as directed    [provider]     Family History Family History  Problem Relation Age of Onset  . Diabetes Mother   . Alcohol abuse Maternal Grandfather   . Alcohol abuse Paternal Grandfather   . Cancer Neg Hx   . Heart disease Neg Hx   . Stomach cancer Neg Hx   . Colon cancer Neg Hx     Social History Social History   Tobacco Use  . Smoking status: Never Smoker  . Smokeless tobacco: Never Used  Substance Use Topics  . Alcohol use: Yes    Alcohol/week: 2.0 standard drinks    Types: 2 Standard drinks or equivalent per week    Comment: beer  . Drug use: Yes    Types: Marijuana     Allergies   Patient has no known allergies.   Review of Systems Review of Systems  Constitutional: Negative for fever.  Respiratory: Negative for shortness of breath.   Cardiovascular: Positive for near-syncope. Negative for chest pain.  Musculoskeletal: Positive for myalgias.  Neurological: Positive for light-headedness.  All other systems reviewed and are negative.    Physical Exam Updated Vital Signs BP 137/82 (BP Location: Right Arm)   Pulse 91   Temp 98.6 F (37 C) (Oral)   Resp 16   Ht 1.778 m (5\' 10" )   Wt 97.5 kg   SpO2 98%   BMI 30.85 kg/m   Physical Exam CONSTITUTIONAL:  Well developed/well nourished HEAD: Normocephalic/atraumatic, no signs of trauma EYES: EOMI/PERRL ENMT: Mucous membranes moist NECK: supple no meningeal signs SPINE/BACK:entire spine nontender CV: S1/S2 noted, no murmurs/rubs/gallops noted LUNGS: Lungs are clear to auscultation bilaterally, no apparent distress ABDOMEN: soft, nontender, no rebound or guarding, bowel sounds noted throughout abdomen GU:no cva tenderness NEURO: Pt is awake/alert/appropriate, moves all extremitiesx4.  No facial droop.   EXTREMITIES: pulses normal/equal, full ROM,  SKIN: warm, color normal PSYCH: Flat affect   ED Treatments / Results  Labs (all labs ordered are listed, but only abnormal results are displayed) Labs Reviewed  BASIC METABOLIC  PANEL - Abnormal; Notable for the following components:      Result Value   Glucose, Bld 107 (*)    All other components within normal limits  CBC WITH DIFFERENTIAL/PLATELET  ETHANOL    EKG EKG Interpretation  Date/Time:  Friday November 15 2018 04:25:33 EDT Ventricular Rate:  89 PR Interval:    QRS Duration: 99 QT Interval:  376 QTC Calculation: 458 R Axis:   82 Text Interpretation:  Sinus rhythm Interpretation limited secondary to artifact No previous ECGs available Confirmed by Zadie RhineWickline, Martin Smeal ([id number]) on 11/15/2018 4:32:45 AM   Repeat EKG   EKG Interpretation  Date/Time:  Friday November 15 2018 05:23:50 EDT Ventricular Rate:  88 PR Interval:    QRS Duration: 97 QT Interval:  377 QTC Calculation: 457 R Axis:   80 Text Interpretation:  Sinus rhythm ST elev, probable normal early repol pattern Confirmed by Zadie RhineWickline, Deaven Urwin ([id number]) on 11/15/2018 5:29:11 AM        Radiology No results found.  Procedures Procedures   Medications Ordered in ED Medications  sodium chloride 0.9 % bolus 1,000 mL (1,000 mLs Intravenous New Bag/Given 11/15/18 0437)     Initial Impression / Assessment and Plan / ED Course  I have reviewed the triage vital signs and the nursing notes.  Pertinent labs   results that were available during my care of the patient were reviewed by me and considered in my medical decision making (see chart for details).        5:29 AM Patient presented after near syncopal episode.  He admits to alcohol and marijuana edible earlier in the evening.  He woke up feeling thirsty and stood up felt lightheaded and laid down.  Initial vitals revealed bradycardia hypotension.  Strong suspicion for vasovagal episode.  Vitals are improved here.  He is low risk for cardiac dysrhythmia.  Repeat EKG is unremarkable. 6:20 AM Patient improved.  He is able to stand without symptoms.  Vitals are appropriate. I reviewed the telemetry monitoring and there is no dysrhythmia Strong  suspicion for vasovagal syncope. Will discharge home.  We discussed strict ER return precautions. He will need to follow-up with PCP if he has further episodes. Final Clinical Impressions(s) / ED Diagnoses   Final diagnoses:  Near syncope  Vasovagal syncope    ED Discharge Orders    None       Zadie RhineWickline, Veeda Virgo, MD 11/15/18 [phone]

## 2019-02-12 DIAGNOSIS — Z0000 Encounter for general adult medical examination without abnormal findings: Secondary | ICD-10-CM | POA: Diagnosis not present

## 2019-03-10 DIAGNOSIS — R109 Unspecified abdominal pain: Secondary | ICD-10-CM | POA: Diagnosis not present

## 2019-03-10 DIAGNOSIS — E785 Hyperlipidemia, unspecified: Secondary | ICD-10-CM | POA: Diagnosis not present

## 2019-03-10 DIAGNOSIS — Z0000 Encounter for general adult medical examination without abnormal findings: Secondary | ICD-10-CM | POA: Diagnosis not present

## 2019-07-14 DIAGNOSIS — R1032 Left lower quadrant pain: Secondary | ICD-10-CM | POA: Diagnosis not present

## 2019-07-14 DIAGNOSIS — R1013 Epigastric pain: Secondary | ICD-10-CM | POA: Diagnosis not present

## 2019-07-14 DIAGNOSIS — E781 Pure hyperglyceridemia: Secondary | ICD-10-CM | POA: Diagnosis not present

## 2019-09-05 DIAGNOSIS — Z3141 Encounter for fertility testing: Secondary | ICD-10-CM | POA: Diagnosis not present

## 2019-09-17 IMAGING — US US ABDOMEN LIMITED
1 series · 14 of 25 positions shown · non-contrast
Comparison: None.

CLINICAL DATA: Intermittent epigastric pain since July 2017

EXAM:
ULTRASOUND ABDOMEN LIMITED RIGHT UPPER QUADRANT

[Series 1: us abdomen limited · 0.19mm/px · 14 of 45 slices shown]
[im 1/45]
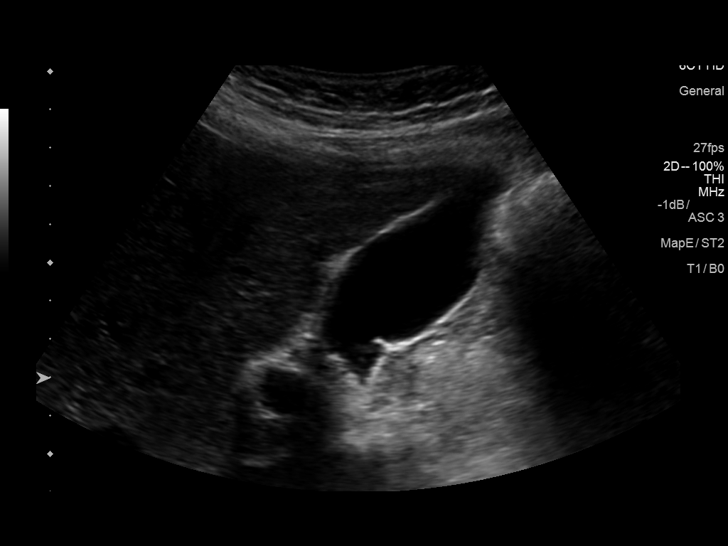
[im 4/45]
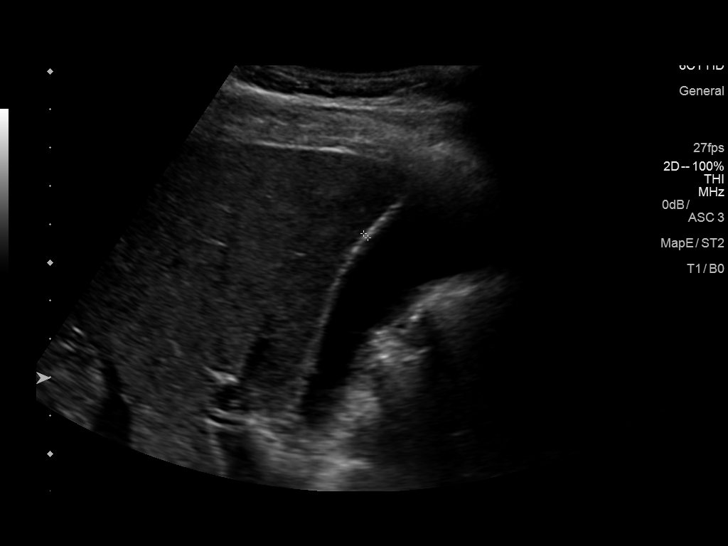
[im 8/45]
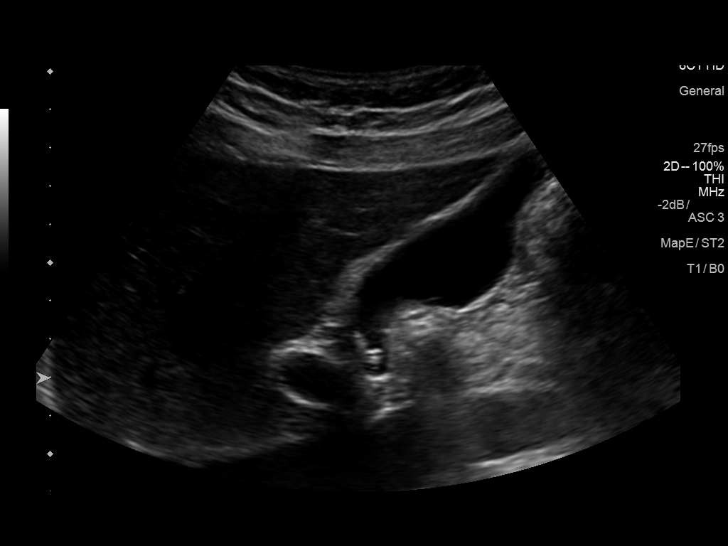
[im 12/45]
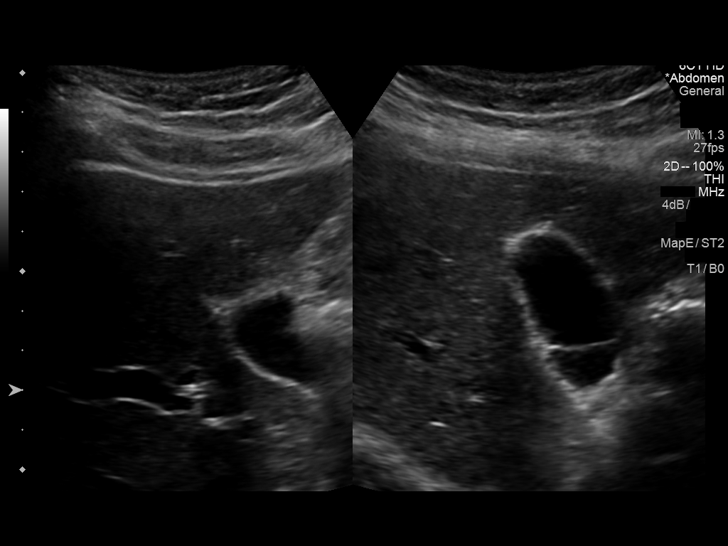
[im 15/45]
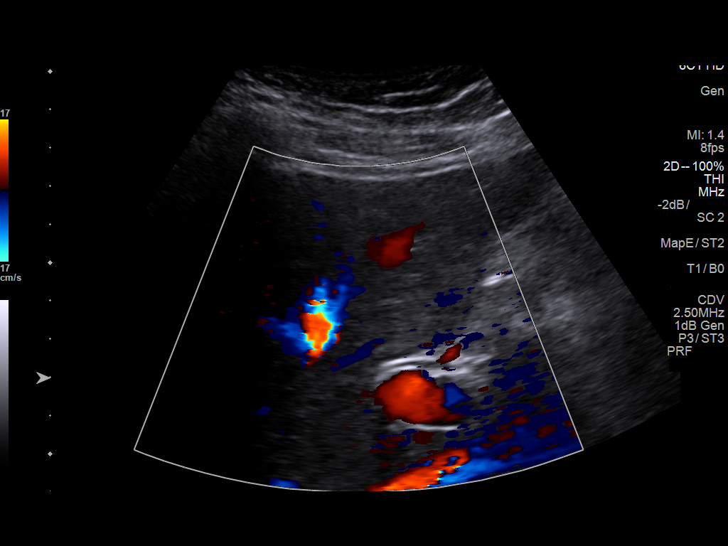
[im 17/45]
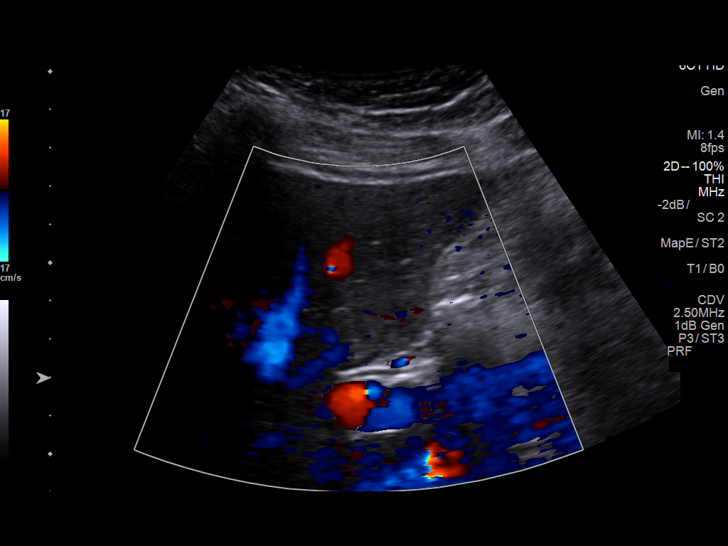
[im 21/45]
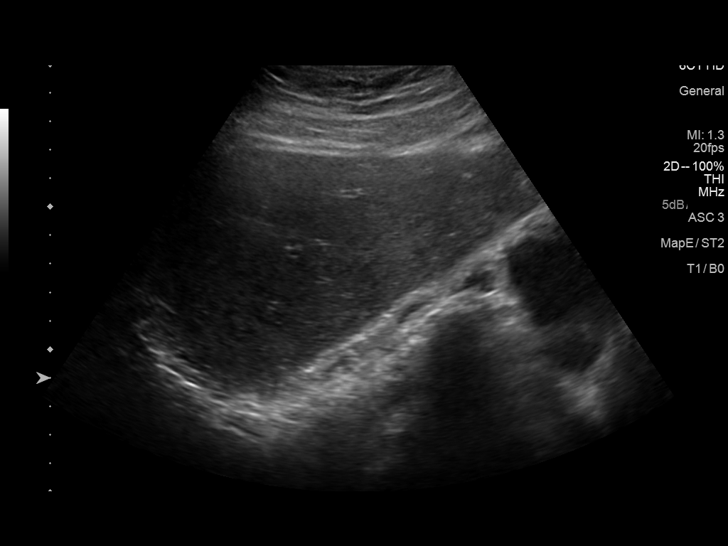
[im 24/45]
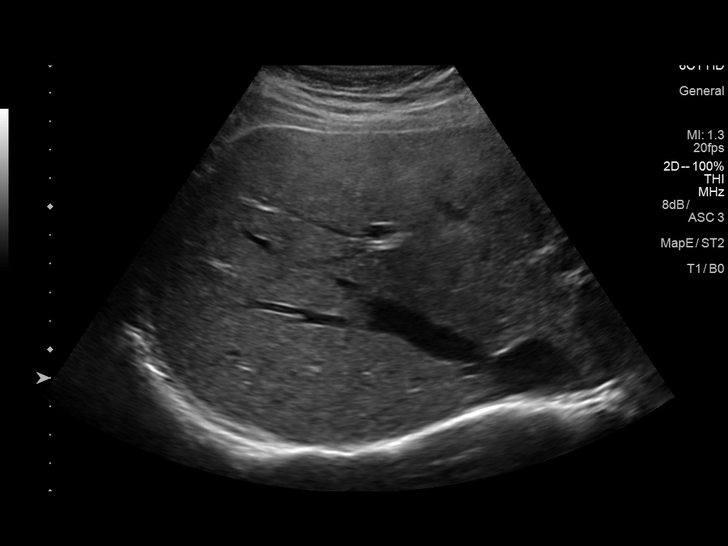
[im 28/45]
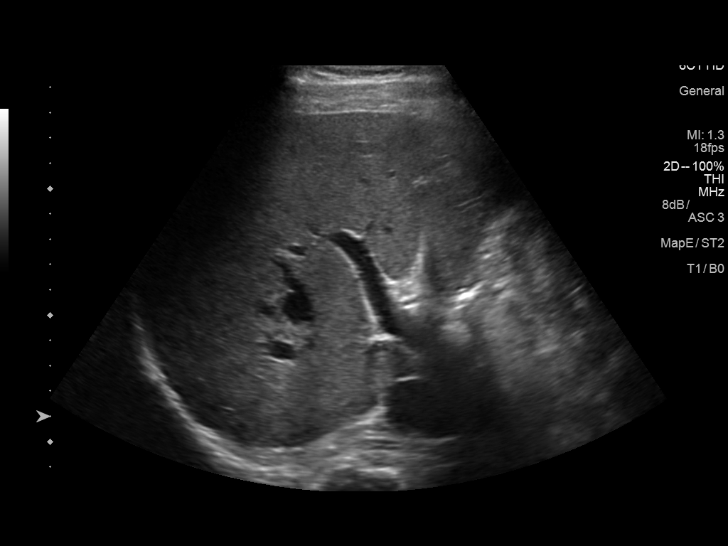
[im 30/45]
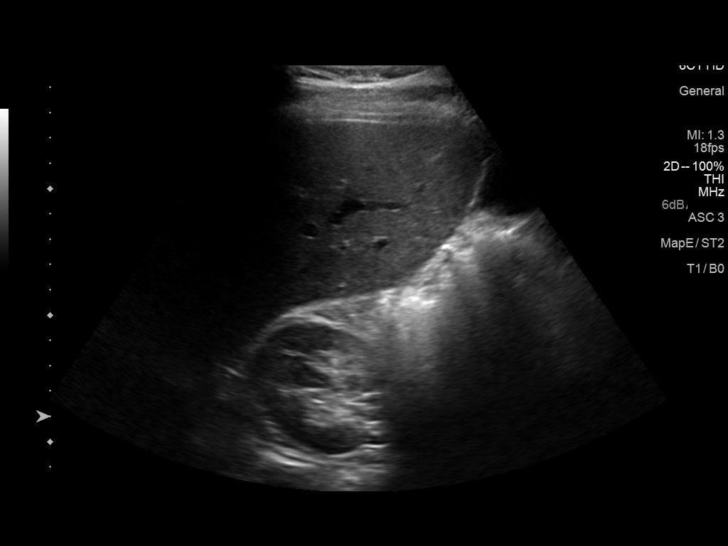
[im 34/45]
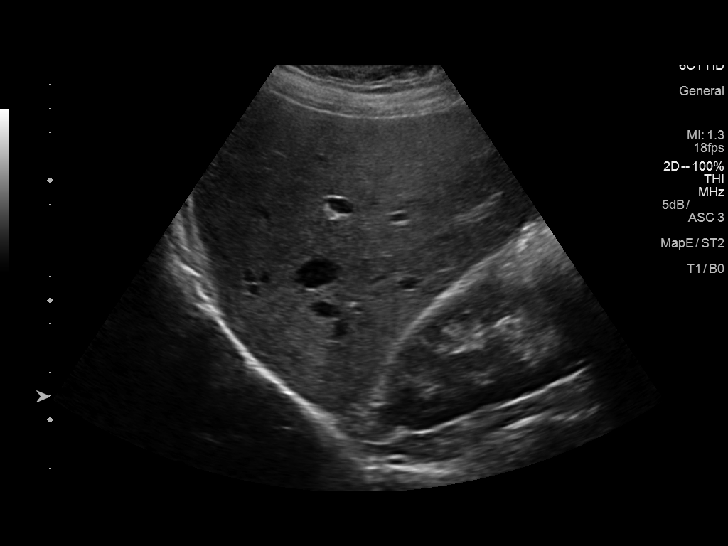
[im 37/45]
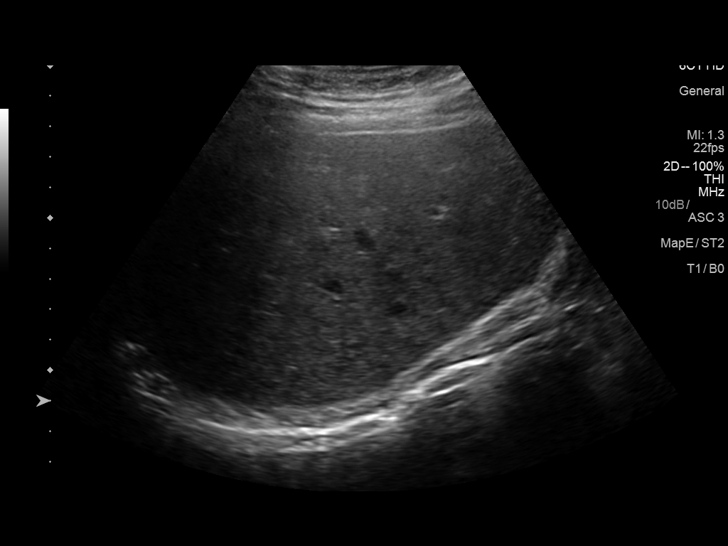
[im 41/45]
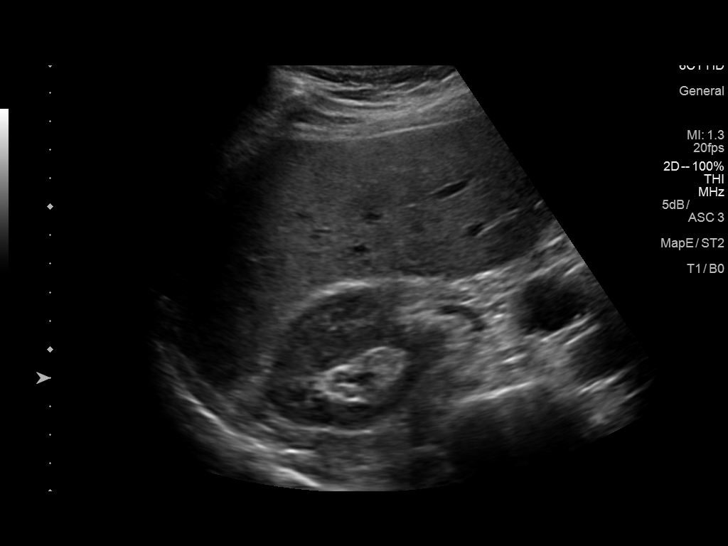
[im 45/45]
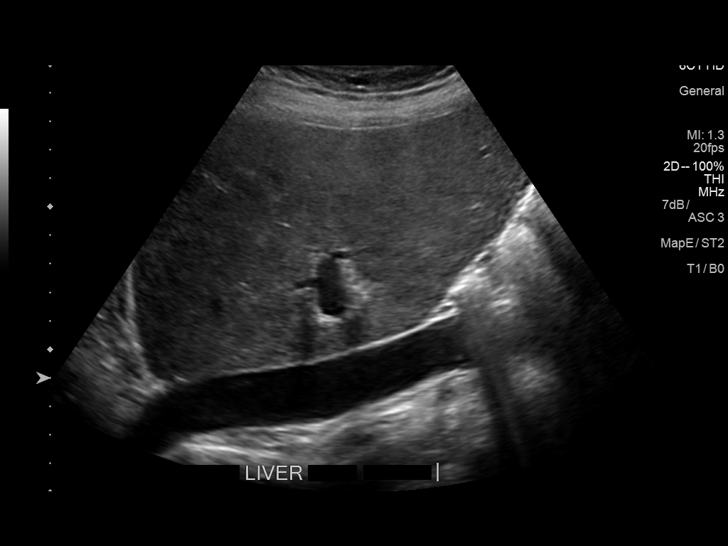

[14 of 25 positions shown; findings below may reference images not displayed]

FINDINGS: Gallbladder:

No gallstones or wall thickening visualized. No sonographic Murphy
sign noted by sonographer.

Common bile duct:

Diameter: 1.9 mm

Liver:

No focal lesion identified. Within normal limits in parenchymal
echogenicity. Portal vein is patent on color Doppler imaging with
normal direction of blood flow towards the liver.
IMPRESSION: Normal right upper quadrant abdominal ultrasound. If there are
clinical concerns of chronic gallbladder dysfunction, a nuclear
medicine hepatobiliary scan with gallbladder ejection fraction
determination may be useful.

## 2020-03-17 DIAGNOSIS — Z0000 Encounter for general adult medical examination without abnormal findings: Secondary | ICD-10-CM | POA: Diagnosis not present

## 2020-03-17 DIAGNOSIS — E785 Hyperlipidemia, unspecified: Secondary | ICD-10-CM | POA: Diagnosis not present

## 2020-03-17 DIAGNOSIS — L509 Urticaria, unspecified: Secondary | ICD-10-CM | POA: Diagnosis not present

## 2020-05-23 DIAGNOSIS — Z1152 Encounter for screening for COVID-19: Secondary | ICD-10-CM | POA: Diagnosis not present

## 2020-10-21 DIAGNOSIS — R0989 Other specified symptoms and signs involving the circulatory and respiratory systems: Secondary | ICD-10-CM | POA: Diagnosis not present

## 2020-10-21 DIAGNOSIS — R0981 Nasal congestion: Secondary | ICD-10-CM | POA: Diagnosis not present

## 2021-04-13 DIAGNOSIS — E785 Hyperlipidemia, unspecified: Secondary | ICD-10-CM | POA: Diagnosis not present

## 2021-04-13 DIAGNOSIS — Z0000 Encounter for general adult medical examination without abnormal findings: Secondary | ICD-10-CM | POA: Diagnosis not present

## 2021-06-24 DIAGNOSIS — F4323 Adjustment disorder with mixed anxiety and depressed mood: Secondary | ICD-10-CM | POA: Diagnosis not present

## 2021-07-22 DIAGNOSIS — F4323 Adjustment disorder with mixed anxiety and depressed mood: Secondary | ICD-10-CM | POA: Diagnosis not present

## 2021-09-02 DIAGNOSIS — F4323 Adjustment disorder with mixed anxiety and depressed mood: Secondary | ICD-10-CM | POA: Diagnosis not present

## 2022-04-25 ENCOUNTER — Other Ambulatory Visit (HOSPITAL_COMMUNITY): Payer: Self-pay | Admitting: Family Medicine

## 2022-04-25 DIAGNOSIS — E785 Hyperlipidemia, unspecified: Secondary | ICD-10-CM
